# Patient Record
Sex: Male | Born: 1965 | Race: White | Hispanic: No | Marital: Single | Smoking: Current every day smoker
Health system: Southern US, Community
[De-identification: ages and names within clinical notes are randomized; demographics above are authoritative.]

## PROBLEM LIST (undated history)

## (undated) DIAGNOSIS — I219 Acute myocardial infarction, unspecified: Secondary | ICD-10-CM

## (undated) DIAGNOSIS — I10 Essential (primary) hypertension: Secondary | ICD-10-CM

## (undated) DIAGNOSIS — I2510 Atherosclerotic heart disease of native coronary artery without angina pectoris: Secondary | ICD-10-CM

## (undated) DIAGNOSIS — R569 Unspecified convulsions: Secondary | ICD-10-CM

## (undated) HISTORY — PX: CORONARY STENT PLACEMENT: SHX1402

---

## 2014-07-07 ENCOUNTER — Emergency Department (HOSPITAL_COMMUNITY): Payer: Self-pay

## 2014-07-07 ENCOUNTER — Inpatient Hospital Stay (HOSPITAL_COMMUNITY)
Admission: EM | Admit: 2014-07-07 | Discharge: 2014-07-09 | DRG: 641 | Disposition: A | Discharge status: Home / Self Care | Payer: Self-pay | Attending: Internal Medicine | Admitting: Internal Medicine

## 2014-07-07 ENCOUNTER — Encounter (HOSPITAL_COMMUNITY): Payer: Self-pay | Admitting: Emergency Medicine

## 2014-07-07 DIAGNOSIS — I252 Old myocardial infarction: Secondary | ICD-10-CM

## 2014-07-07 DIAGNOSIS — R7401 Elevation of levels of liver transaminase levels: Secondary | ICD-10-CM

## 2014-07-07 DIAGNOSIS — Z955 Presence of coronary angioplasty implant and graft: Secondary | ICD-10-CM

## 2014-07-07 DIAGNOSIS — Y900 Blood alcohol level of less than 20 mg/100 ml: Secondary | ICD-10-CM | POA: Diagnosis present

## 2014-07-07 DIAGNOSIS — R569 Unspecified convulsions: Secondary | ICD-10-CM

## 2014-07-07 DIAGNOSIS — F1010 Alcohol abuse, uncomplicated: Secondary | ICD-10-CM

## 2014-07-07 DIAGNOSIS — R740 Nonspecific elevation of levels of transaminase and lactic acid dehydrogenase [LDH]: Secondary | ICD-10-CM

## 2014-07-07 DIAGNOSIS — Z79899 Other long term (current) drug therapy: Secondary | ICD-10-CM

## 2014-07-07 DIAGNOSIS — Z7982 Long term (current) use of aspirin: Secondary | ICD-10-CM

## 2014-07-07 DIAGNOSIS — F1020 Alcohol dependence, uncomplicated: Secondary | ICD-10-CM | POA: Diagnosis present

## 2014-07-07 DIAGNOSIS — F17200 Nicotine dependence, unspecified, uncomplicated: Secondary | ICD-10-CM

## 2014-07-07 DIAGNOSIS — F17210 Nicotine dependence, cigarettes, uncomplicated: Secondary | ICD-10-CM | POA: Diagnosis present

## 2014-07-07 DIAGNOSIS — I2510 Atherosclerotic heart disease of native coronary artery without angina pectoris: Secondary | ICD-10-CM | POA: Diagnosis present

## 2014-07-07 DIAGNOSIS — G40909 Epilepsy, unspecified, not intractable, without status epilepticus: Secondary | ICD-10-CM | POA: Diagnosis present

## 2014-07-07 DIAGNOSIS — I10 Essential (primary) hypertension: Secondary | ICD-10-CM

## 2014-07-07 DIAGNOSIS — E871 Hypo-osmolality and hyponatremia: Principal | ICD-10-CM

## 2014-07-07 HISTORY — DX: Essential (primary) hypertension: I10

## 2014-07-07 HISTORY — DX: Acute myocardial infarction, unspecified: I21.9

## 2014-07-07 HISTORY — DX: Unspecified convulsions: R56.9

## 2014-07-07 HISTORY — DX: Atherosclerotic heart disease of native coronary artery without angina pectoris: I25.10

## 2014-07-07 LAB — COMPREHENSIVE METABOLIC PANEL
ALBUMIN: 4.3 g/dL (ref 3.5–5.0)
ALT: 102 U/L — AB (ref 17–63)
AST: 77 U/L — AB (ref 15–41)
Alkaline Phosphatase: 72 U/L (ref 38–126)
Anion gap: 10 (ref 5–15)
BUN: 10 mg/dL (ref 6–20)
CO2: 21 mmol/L — ABNORMAL LOW (ref 22–32)
Calcium: 9.1 mg/dL (ref 8.9–10.3)
Chloride: 89 mmol/L — ABNORMAL LOW (ref 101–111)
Creatinine, Ser: 0.68 mg/dL (ref 0.61–1.24)
GFR calc Af Amer: >60 mL/min (ref >60)
GFR calc non Af Amer: >60 mL/min (ref >60)
Glucose, Bld: 123 mg/dL — ABNORMAL HIGH (ref 65–99)
Potassium: 4.9 mmol/L (ref 3.5–5.1)
Sodium: 120 mmol/L — ABNORMAL LOW (ref 135–145)
TOTAL PROTEIN: 7.6 g/dL (ref 6.5–8.1)
Total Bilirubin: 0.5 mg/dL (ref 0.3–1.2)

## 2014-07-07 LAB — CBC WITH DIFFERENTIAL/PLATELET
Basophils Absolute: 0 10*3/uL (ref 0.0–0.1)
Basophils Relative: 0 % (ref 0–1)
EOS PCT: 1 % (ref 0–5)
Eosinophils Absolute: 0.1 10*3/uL (ref 0.0–0.7)
HEMATOCRIT: 39.9 % (ref 39.0–52.0)
Hemoglobin: 14.2 g/dL (ref 13.0–17.0)
LYMPHS PCT: 12 % (ref 12–46)
Lymphs Abs: 1.1 10*3/uL (ref 0.7–4.0)
MCH: 28.3 pg (ref 26.0–34.0)
MCHC: 35.6 g/dL (ref 30.0–36.0)
MCV: 79.6 fL (ref 78.0–100.0)
MONOS PCT: 9 % (ref 3–12)
Monocytes Absolute: 0.9 10*3/uL (ref 0.1–1.0)
NEUTROS ABS: 7.7 10*3/uL (ref 1.7–7.7)
Neutrophils Relative %: 78 % — ABNORMAL HIGH (ref 43–77)
PLATELETS: 312 10*3/uL (ref 150–400)
RBC: 5.01 MIL/uL (ref 4.22–5.81)
RDW: 14.5 % (ref 11.5–15.5)
WBC: 9.8 10*3/uL (ref 4.0–10.5)

## 2014-07-07 LAB — BASIC METABOLIC PANEL
Anion gap: 8 (ref 5–15)
BUN: 9 mg/dL (ref 6–20)
CHLORIDE: 95 mmol/L — AB (ref 101–111)
CO2: 23 mmol/L (ref 22–32)
Calcium: 8.5 mg/dL — ABNORMAL LOW (ref 8.9–10.3)
Creatinine, Ser: 0.67 mg/dL (ref 0.61–1.24)
GLUCOSE: 100 mg/dL — AB (ref 65–99)
Potassium: 4.3 mmol/L (ref 3.5–5.1)
SODIUM: 126 mmol/L — AB (ref 135–145)

## 2014-07-07 LAB — I-STAT CHEM 8, ED
BUN: 9 mg/dL (ref 6–20)
CHLORIDE: 90 mmol/L — AB (ref 101–111)
Calcium, Ion: 1.14 mmol/L (ref 1.12–1.23)
Creatinine, Ser: 0.7 mg/dL (ref 0.61–1.24)
GLUCOSE: 115 mg/dL — AB (ref 65–99)
HEMATOCRIT: 47 % (ref 39.0–52.0)
Hemoglobin: 16 g/dL (ref 13.0–17.0)
POTASSIUM: 5 mmol/L (ref 3.5–5.1)
Sodium: 122 mmol/L — ABNORMAL LOW (ref 135–145)
TCO2: 20 mmol/L (ref 0–100)

## 2014-07-07 LAB — ETHANOL: Alcohol, Ethyl (B): <5 mg/dL (ref <5)

## 2014-07-07 LAB — MAGNESIUM: Magnesium: 1.9 mg/dL (ref 1.7–2.4)

## 2014-07-07 MED ORDER — PRAVASTATIN SODIUM 10 MG PO TABS
20.0000 mg | ORAL_TABLET | Freq: Every day | ORAL | Status: DC
Start: 2014-07-07 — End: 2014-07-09
  Administered 2014-07-07 – 2014-07-08 (×2): 20 mg via ORAL
  Filled 2014-07-07 (×2): qty 2

## 2014-07-07 MED ORDER — SODIUM CHLORIDE 0.9 % IJ SOLN: 3.0000 mL | Freq: Two times a day (BID) | INTRAMUSCULAR | Status: DC

## 2014-07-07 MED ORDER — HEPARIN SODIUM (PORCINE) 5000 UNIT/ML IJ SOLN
5000.0000 [IU] | Freq: Three times a day (TID) | INTRAMUSCULAR | Status: DC
  Administered 2014-07-07 – 2014-07-09 (×5): 5000 [IU] via SUBCUTANEOUS
  Filled 2014-07-07 (×5): qty 1

## 2014-07-07 MED ORDER — ASPIRIN 325 MG PO TABS
325.0000 mg | ORAL_TABLET | Freq: Every day | ORAL | Status: DC
  Administered 2014-07-08 – 2014-07-09 (×2): 325 mg via ORAL
  Filled 2014-07-07 (×2): qty 1

## 2014-07-07 MED ORDER — METOPROLOL TARTRATE 50 MG PO TABS
50.0000 mg | ORAL_TABLET | Freq: Two times a day (BID) | ORAL | Status: DC
  Administered 2014-07-07 – 2014-07-09 (×4): 50 mg via ORAL
  Filled 2014-07-07 (×4): qty 1

## 2014-07-07 MED ORDER — SODIUM CHLORIDE 0.9 % IV SOLN
INTRAVENOUS | Status: AC
  Administered 2014-07-07: 18:00:00 via INTRAVENOUS

## 2014-07-07 MED ORDER — FOLIC ACID 1 MG PO TABS
1.0000 mg | ORAL_TABLET | Freq: Every day | ORAL | Status: DC
  Administered 2014-07-07 – 2014-07-09 (×3): 1 mg via ORAL
  Filled 2014-07-07 (×4): qty 1

## 2014-07-07 MED ORDER — ONDANSETRON HCL 4 MG PO TABS: 4.0000 mg | ORAL_TABLET | Freq: Four times a day (QID) | ORAL | Status: DC | PRN

## 2014-07-07 MED ORDER — LORAZEPAM 2 MG/ML IJ SOLN: 1.0000 mg | Freq: Four times a day (QID) | INTRAMUSCULAR | Status: DC | PRN

## 2014-07-07 MED ORDER — ADULT MULTIVITAMIN W/MINERALS CH
1.0000 | ORAL_TABLET | Freq: Every day | ORAL | Status: DC
  Administered 2014-07-07 – 2014-07-09 (×3): 1 via ORAL
  Filled 2014-07-07 (×3): qty 1

## 2014-07-07 MED ORDER — NICOTINE 7 MG/24HR TD PT24
MEDICATED_PATCH | TRANSDERMAL | Status: AC
  Filled 2014-07-07: qty 1

## 2014-07-07 MED ORDER — NICOTINE 7 MG/24HR TD PT24
7.0000 mg | MEDICATED_PATCH | Freq: Every day | TRANSDERMAL | Status: DC
  Administered 2014-07-07 – 2014-07-09 (×3): 7 mg via TRANSDERMAL
  Filled 2014-07-07 (×6): qty 1

## 2014-07-07 MED ORDER — LISINOPRIL 10 MG PO TABS
40.0000 mg | ORAL_TABLET | Freq: Every day | ORAL | Status: DC
  Administered 2014-07-08 – 2014-07-09 (×2): 40 mg via ORAL
  Filled 2014-07-07 (×2): qty 4

## 2014-07-07 MED ORDER — ONDANSETRON HCL 4 MG/2ML IJ SOLN: 4.0000 mg | Freq: Four times a day (QID) | INTRAMUSCULAR | Status: DC | PRN

## 2014-07-07 MED ORDER — VITAMIN B-1 100 MG PO TABS
100.0000 mg | ORAL_TABLET | Freq: Every day | ORAL | Status: DC
  Administered 2014-07-07 – 2014-07-09 (×3): 100 mg via ORAL
  Filled 2014-07-07 (×3): qty 1

## 2014-07-07 MED ORDER — SODIUM CHLORIDE 0.9 % IV SOLN: INTRAVENOUS | Status: DC

## 2014-07-07 MED ORDER — SODIUM CHLORIDE 0.9 % IV SOLN
INTRAVENOUS | Status: DC
  Administered 2014-07-08 (×2): via INTRAVENOUS

## 2014-07-07 MED ORDER — LORAZEPAM 2 MG/ML IJ SOLN
2.0000 mg | Freq: Once | INTRAMUSCULAR | Status: AC
  Administered 2014-07-07: 2 mg via INTRAVENOUS

## 2014-07-07 MED ORDER — LORAZEPAM 1 MG PO TABS: 1.0000 mg | ORAL_TABLET | Freq: Four times a day (QID) | ORAL | Status: DC | PRN

## 2014-07-07 MED ORDER — LORAZEPAM 2 MG/ML IJ SOLN
INTRAMUSCULAR | Status: AC
  Administered 2014-07-07: 2 mg via INTRAVENOUS
  Filled 2014-07-07: qty 1

## 2014-07-07 MED ORDER — THIAMINE HCL 100 MG/ML IJ SOLN: 100.0000 mg | Freq: Every day | INTRAMUSCULAR | Status: DC

## 2014-07-07 MED ORDER — SODIUM CHLORIDE 0.9 % IV BOLUS (SEPSIS)
1000.0000 mL | Freq: Once | INTRAVENOUS | Status: AC
  Administered 2014-07-07: 1000 mL via INTRAVENOUS

## 2014-07-07 NOTE — ED Notes (Signed)
Ems called to pt's house for witnessed seizure by family. Family states seizure was approximately 1-3 minutes in duration. EMS stated that when they arrived, pt appeared post-ictal but was awake and talking, just a "little confused". Pt has known hx of seizures. Pt states he has anxiety and has been anxious lately due to moving. Pt also states that he had "2 beers" with lunch.

## 2014-07-07 NOTE — ED Provider Notes (Signed)
CSN: [id number]     Arrival date & time 07/07/14  1411 History   First MD Initiated Contact with Patient 07/07/14 1418     Chief Complaint  Patient presents with  . Seizures     (Consider location/radiation/quality/duration/timing/severity/associated sxs/prior Treatment) Patient is a 49 y.o. male presenting with seizures. The history is provided by the patient and the EMS personnel.  Seizures  patient with witnessed seizure lasted about 1-2 minutes. Generalized in nature postictal phase was about 30 minutes. Patient with a history of seizures but never started on seizure meds. Patient does drink alcohol frequently. Patient states that does not been eating very well not sleeping well. Patient felt that he bit his tongue a little bit no incontinence. Last seizure was probably 2 years ago. Patient does relate to having an MRI and EEG in the past and said that the findings were inconclusive. Patient without any specific complaints now.  Past Medical History  Diagnosis Date  . Hypertension   . Seizures   . Coronary artery disease   . Heart attack    History reviewed. No pertinent past surgical history. History reviewed. No pertinent family history. History  Substance Use Topics  . Smoking status: Current Every Day Smoker -- 1.00 packs/day    Types: Cigarettes  . Smokeless tobacco: Not on file  . Alcohol Use: 14.4 oz/week    24 Cans of beer per week    Review of Systems  Constitutional: Positive for appetite change. Negative for fever.  HENT: Negative for congestion.   Eyes: Negative for redness.  Respiratory: Negative for shortness of breath.   Cardiovascular: Negative for chest pain.  Gastrointestinal: Negative for abdominal pain.  Genitourinary: Negative for dysuria and hematuria.  Musculoskeletal: Negative for back pain.  Skin: Negative for rash.  Neurological: Positive for seizures. Negative for headaches.  Hematological: Does not bruise/bleed easily.   Psychiatric/Behavioral: Negative for confusion.      Allergies  Review of patient's allergies indicates no known allergies.  Home Medications   Prior to Admission medications   Medication Sig Start Date End Date Taking? Authorizing Provider  aspirin 325 MG tablet Take 325 mg by mouth daily.   Yes Historical Provider, MD  lisinopril (PRINIVIL,ZESTRIL) 40 MG tablet Take 40 mg by mouth daily.   Yes Historical Provider, MD  lovastatin (MEVACOR) 20 MG tablet Take 20 mg by mouth at bedtime.   Yes Historical Provider, MD  metoprolol (LOPRESSOR) 50 MG tablet Take 50 mg by mouth 2 (two) times daily.   Yes Historical Provider, MD   BP 118/71 mmHg  Pulse 76  Temp(Src) 98.5 F (36.9 C) (Oral)  Resp 32  Ht  (1.651 m)  Wt 160 lb (72.576 kg)  BMI 26.63 kg/m2  SpO2 97% Physical Exam  Constitutional: He is oriented to person, place, and time. He appears well-developed and well-nourished. No distress.  HENT:  Head: Normocephalic and atraumatic.  Mouth/Throat: Oropharynx is clear and moist.  Eyes: Conjunctivae and EOM are normal. Pupils are equal, round, and reactive to light.  Neck: Normal range of motion. Neck supple.  Cardiovascular: Normal rate, regular rhythm and normal heart sounds.   No murmur heard. Pulmonary/Chest: Effort normal and breath sounds normal.  Abdominal: Soft. Bowel sounds are normal. There is no tenderness.  Musculoskeletal: Normal range of motion. He exhibits no edema.  Neurological: He is alert and oriented to person, place, and time. No cranial nerve deficit. He exhibits normal muscle tone. Coordination normal.  Skin: Skin is warm.  No rash noted.  Nursing note and vitals reviewed.   ED Course  Procedures (including critical care time) Labs Review Labs Reviewed  COMPREHENSIVE METABOLIC PANEL - Abnormal; Notable for the following:    Sodium 120 (*)    Chloride 89 (*)    CO2 21 (*)    Glucose, Bld 123 (*)    AST 77 (*)    ALT 102 (*)    All other  components within normal limits  CBC WITH DIFFERENTIAL/PLATELET - Abnormal; Notable for the following:    Neutrophils Relative % 78 (*)    All other components within normal limits  MAGNESIUM  ETHANOL   Results for orders placed or performed during the hospital encounter of 07/07/14  Comprehensive metabolic panel  Result Value Ref Range   Sodium 120 (L) 135 - 145 mmol/L   Potassium 4.9 3.5 - 5.1 mmol/L   Chloride 89 (L) 101 - 111 mmol/L   CO2 21 (L) 22 - 32 mmol/L   Glucose, Bld 123 (H) 65 - 99 mg/dL   BUN 10 6 - 20 mg/dL   Creatinine, Ser 1.910.68 0.61 - 1.24 mg/dL   Calcium 9.1 8.9 - 47.810.3 mg/dL   Total Protein 7.6 6.5 - 8.1 g/dL   Albumin 4.3 3.5 - 5.0 g/dL   AST 77 (H) 15 - 41 U/L   ALT 102 (H) 17 - 63 U/L   Alkaline Phosphatase 72 38 - 126 U/L   Total Bilirubin 0.5 0.3 - 1.2 mg/dL   GFR calc non Af Amer >60 >60 mL/min   GFR calc Af Amer >60 >60 mL/min   Anion gap 10 5 - 15  CBC with Differential/Platelet  Result Value Ref Range   WBC 9.8 4.0 - 10.5 K/uL   RBC 5.01 4.22 - 5.81 MIL/uL   Hemoglobin 14.2 13.0 - 17.0 g/dL   HCT 29.539.9 62.139.0 - 30.852.0 %   MCV 79.6 78.0 - 100.0 fL   MCH 28.3 26.0 - 34.0 pg   MCHC 35.6 30.0 - 36.0 g/dL   RDW 65.714.5 84.611.5 - 96.215.5 %   Platelets 312 150 - 400 K/uL   Neutrophils Relative % 78 (H) 43 - 77 %   Neutro Abs 7.7 1.7 - 7.7 K/uL   Lymphocytes Relative 12 12 - 46 %   Lymphs Abs 1.1 0.7 - 4.0 K/uL   Monocytes Relative 9 3 - 12 %   Monocytes Absolute 0.9 0.1 - 1.0 K/uL   Eosinophils Relative 1 0 - 5 %   Eosinophils Absolute 0.1 0.0 - 0.7 K/uL   Basophils Relative 0 0 - 1 %   Basophils Absolute 0.0 0.0 - 0.1 K/uL  Magnesium  Result Value Ref Range   Magnesium 1.9 1.7 - 2.4 mg/dL     Imaging Review Ct Head Wo Contrast  07/07/2014   CLINICAL DATA:  Seizure and confusion.  EXAM: CT HEAD WITHOUT CONTRAST  TECHNIQUE: Contiguous axial images were obtained from the base of the skull through the vertex without intravenous contrast.  COMPARISON:  None.   FINDINGS: The brain demonstrates no evidence of hemorrhage, infarction, edema, mass effect, extra-axial fluid collection, hydrocephalus or mass lesion. The skull is unremarkable. There is some mucosal thickening and a right frontal air cell extending into the fronto-ethmoid region.  IMPRESSION: Normal head CT.   Electronically Signed   By: Irish LackGlenn  Yamagata M.D.   On: 07/07/2014 16:23     EKG Interpretation   Date/Time:  Wednesday Jul 07 2014 14:12:11 EDT Ventricular  Rate:  80 PR Interval:  222 QRS Duration: 98 QT Interval:  355 QTC Calculation: 409 R Axis:   80 Text Interpretation:  Sinus rhythm Prolonged PR interval No previous ECGs  available Artifact Confirmed by Marylou Wages  MD, Lesha Jager [phone]([id number]) on 07/07/2014  2:20:32 PM      CRITICAL CARE Performed by: Vanetta MuldersZACKOWSKI,Alyrica Thurow Total critical care time: 30 Critical care time was exclusive of separately billable procedures and treating other patients. Critical care was necessary to treat or prevent imminent or life-threatening deterioration. Critical care was time spent personally by me on the following activities: development of treatment plan with patient and/or surrogate as well as nursing, discussions with consultants, evaluation of patient's response to treatment, examination of patient, obtaining history from patient or surrogate, ordering and performing treatments and interventions, ordering and review of laboratory studies, ordering and review of radiographic studies, pulse oximetry and re-evaluation of patient's condition.      MDM   Final diagnoses:  Seizure  Hyponatremia    Recurrent seizure. Patient had seizure prior to arrival. Patient's last seizure was probably greater than 2 years ago not on medication. Patient does drink beer frequently. Could be controlled reading. Also patient sodium is low at 120. Will be treated with normal saline. Treated with Ativan with the second seizure. Will require admission.  Second seizure  lasted about 2 minutes generalized seizure very brief postictal phase. Patient still little confused but is talking. Not back completely normal. Patient was given 2 mg of Ativan during the seizure activity.  Chemistries otherwise without any significant abnormalities. Alcohol level pending.    Vanetta MuldersScott Lauranne Beyersdorf, MD 07/07/14 [phone]

## 2014-07-07 NOTE — H&P (Signed)
[facility] History and Physical  Antonio SwazilandJordan BJY:[id number] DOB: 03/09/1965 DOA: 07/07/2014  Referring physician: Dr Deretha EmoryZackowski - [facility] PCP: No PCP Per Patient   Chief Complaint: Seizure  HPI: Antonio SwazilandJordan is a 49 y.o. male  Patient states he started feeling a little "ill" one day ago. This involved some upset stomach and one or 2 loose stools. Today around approximately 1300 patient had a witnessed seizure which was described as his entire body shaking with his arms pulled close to his body. Of note patient was standing by his bed and fell onto the bed at the time of the seizure. There is no head trauma or other bodily injury at the time of the seizure. The patient approximately 30-45 minutes to "come around". EMS was called to bring patient to [facility]. Of note patient drinks a baseline of 8 beers per day. This is actually an improvement over his past alcoholic drinking which was significantly more. Patient takes very little other nutrition on a daily basis per family members who live with the patient. Patient does not have a primary care physician and does not follow-up at with a physician on a regular basis. Patient has had one other seizure in his life and reports having an MRI and EEG which were normal.  Of note patient is a smoker but reports having difficulty getting through 1 cigarette as this causes him to become short of breath. This patient's baseline over the last many months. Patient continues to smoke half pack per day.  Per patient and family members he has not had any recent complaints of fevers, headache, neck stiffness, rash, chest pain, shortness of breath, palpitations, LOC, dysuria, frequency, back pain, unexplained weight loss, night sweats, melena, hematochezia, hematemesis.   Review of Systems:   Patient currently post ictal and unable to give any more reliable history outside of what is mentioned in history of present illness.  Past Medical History  Diagnosis Date   . Hypertension   . Seizures   . Coronary artery disease   . Heart attack    Past Surgical History  Procedure Laterality Date  . Coronary stent placement     Social History:  reports that he has been smoking Cigarettes.  He has been smoking about 0.50 packs per day. He does not have any smokeless tobacco history on file. He reports that he drinks about 33.6 oz of alcohol per week. His drug history is not on file.  No Known Allergies  Family History  Problem Relation Age of Onset  . Heart attack Father   . Heart attack Mother   . Hypertension Mother      Prior to Admission medications   Medication Sig Start Date End Date Taking? Authorizing Provider  aspirin 325 MG tablet Take 325 mg by mouth daily.   Yes Historical Provider, MD  lisinopril (PRINIVIL,ZESTRIL) 40 MG tablet Take 40 mg by mouth daily.   Yes Historical Provider, MD  lovastatin (MEVACOR) 20 MG tablet Take 20 mg by mouth at bedtime.   Yes Historical Provider, MD  metoprolol (LOPRESSOR) 50 MG tablet Take 50 mg by mouth 2 (two) times daily.   Yes Historical Provider, MD   Physical Exam: Filed Vitals:   07/07/14 1600 07/07/14 1630 07/07/14 1645 07/07/14 1826  BP: 117/72 118/71  134/84  Pulse: 66 76  88  Temp:    99 F (37.2 C)  TempSrc:    Oral  Resp: 16 13 32 16  Height:  Weight:      SpO2: 98% 97%  98%    Wt Readings from Last 3 Encounters:  07/07/14 72.576 kg (160 lb)    General:  Appears calm and comfortable Eyes:  PERRL, normal lids, irises & conjunctiva ENT: Dry mucous membranes  Neck:  no LAD, masses or thyromegaly Cardiovascular:  RRR, faint heart sounds, no m/r/g. No LE edema. Telemetry:  SR, no arrhythmias  Respiratory:  CTA bilaterally, no w/r/r. Normal respiratory effort. Abdomen:  soft, ntnd, NABS Skin:  no rash or induration seen on limited exam Musculoskeletal:  grossly normal tone BUE/BLE Psychiatric:  Alert and oriented 3. Slow to speak but answers questions appropriately. Patient  with understanding as to why he is in the hospital. Neurologic: Cranial nerves II through XII grossly intact, his oxygen in coordinated fashion. .          Labs on Admission:  Basic Metabolic Panel:  Recent Labs Lab 07/07/14 1513 07/07/14 1737  NA 120* 122*  K 4.9 5.0  CL 89* 90*  CO2 21*  --   GLUCOSE 123* 115*  BUN 10 9  CREATININE 0.68 0.70  CALCIUM 9.1  --   MG 1.9  --    Liver Function Tests:  Recent Labs Lab 07/07/14 1513  AST 77*  ALT 102*  ALKPHOS 72  BILITOT 0.5  PROT 7.6  ALBUMIN 4.3   No results for input(s): LIPASE, AMYLASE in the last 168 hours. No results for input(s): AMMONIA in the last 168 hours. CBC:  Recent Labs Lab 07/07/14 1513 07/07/14 1737  WBC 9.8  --   NEUTROABS 7.7  --   HGB 14.2 16.0  HCT 39.9 47.0  MCV 79.6  --   PLT 312  --    Cardiac Enzymes: No results for input(s): CKTOTAL, CKMB, CKMBINDEX, TROPONINI in the last 168 hours.  BNP (last 3 results) No results for input(s): BNP in the last 8760 hours.  ProBNP (last 3 results) No results for input(s): PROBNP in the last 8760 hours.  CBG: No results for input(s): GLUCAP in the last 168 hours.  Radiological Exams on Admission: Ct Head Wo Contrast  07/07/2014   CLINICAL DATA:  Seizure and confusion.  EXAM: CT HEAD WITHOUT CONTRAST  TECHNIQUE: Contiguous axial images were obtained from the base of the skull through the vertex without intravenous contrast.  COMPARISON:  None.  FINDINGS: The brain demonstrates no evidence of hemorrhage, infarction, edema, mass effect, extra-axial fluid collection, hydrocephalus or mass lesion. The skull is unremarkable. There is some mucosal thickening and a right frontal air cell extending into the fronto-ethmoid region.  IMPRESSION: Normal head CT.   Electronically Signed   By: Irish Lack M.D.   On: 07/07/2014 16:23    EKG: Independently reviewed. Sinus, prolonged PR, no sign of ACS  Assessment/Plan Principal Problem:    Hyponatremia Active Problems:   Seizure   ETOH abuse   Tobacco dependence   Transaminitis   Stented coronary artery   Essential hypertension   Seizure: 2 witnessed seizures  (1 in ED ). Likely multifactorial including severe hyponatremia, underlying seizure disorder, alcohol withdrawal. History of one other seizure 2 years ago. Underwent neurologic workup including MRI and EEG and was told there is no known cause for his seizure. Does not take seizure medications. Of note NA 120 on admission. Patient drinks approximately 8 beers per day (this is less than what his previous baseline was ). Head CT negative. Unlikely infectious etiology. WBC 9.8. Afebrile, vital signs  stable. - Telemetry  - UDS - Sodium correction as below - Ativan when necessary  Hyponatremia: NA 120. Likely chronic. Likely secondary to poor nutrition and excessive alcohol intake. Suspect patient progressing towards severe hepatic cirrhosis. - Normal saline - Correction goal of less than 638meq/L increase per 24 hours - BMET Q6  EtOH abuse: Patient's currently drinking a baseline of 8 beers per day. This is a improvement over his previous baselines. - CIWA  Cirrhosis: Suspect patient with underlying cirrhosis though no formal diagnoses given his history of EtOH abuse in presentation with hyponatremia. AST 77, ALT 102 - GI consult in am (to discuss either inpatient versus outpatient workup) - Hepatitis panel  Tobacco abuse: Patient smoking half pack per day. The only reason patient does not smoke [employer] because he is unable to due to a sensation of shortness of breath every time he smokes. Suspect patient with underlying COPD - Nicotine patch - Outpatient PFTs  Upset stomach: Likely secondary to mild viral gastroenteritis versus alcohol withdrawal versus other abdominal process. - Zofran - Monitor  Hypertension: Normotensive on presentation - Continue lisinopril and Metop  CAD: Status post coronary cath and stent  placement several years ago at [facility]. - Continue aspirin, Statin  Social: Patient without insurance and no outpatient medical care  - Social work consult     Code Status: FULL DVT Prophylaxis: Hep Family Communication: Sister and Nephew Disposition Plan: pending improvement     Shadiyah Wernli J, MD Family Medicine [facility] [email or web address] Password [email or web address]

## 2014-07-08 LAB — RAPID URINE DRUG SCREEN, HOSP PERFORMED
AMPHETAMINES: NOT DETECTED
BENZODIAZEPINES: POSITIVE — AB
Barbiturates: NOT DETECTED
COCAINE: NOT DETECTED
Opiates: NOT DETECTED
Tetrahydrocannabinol: NOT DETECTED

## 2014-07-08 LAB — HEPATITIS PANEL, ACUTE
HCV Ab: NEGATIVE
HEP B C IGM: NONREACTIVE
HEP B S AG: NEGATIVE
Hep A IgM: NONREACTIVE

## 2014-07-08 LAB — BASIC METABOLIC PANEL
Anion gap: 7 (ref 5–15)
Anion gap: 11 (ref 5–15)
BUN: 7 mg/dL (ref 6–20)
BUN: 9 mg/dL (ref 6–20)
CALCIUM: 8.4 mg/dL — AB (ref 8.9–10.3)
CO2: 24 mmol/L (ref 22–32)
CO2: 20 mmol/L — ABNORMAL LOW (ref 22–32)
CREATININE: 0.74 mg/dL (ref 0.61–1.24)
CREATININE: 0.79 mg/dL (ref 0.61–1.24)
Calcium: 8.5 mg/dL — ABNORMAL LOW (ref 8.9–10.3)
Chloride: 96 mmol/L — ABNORMAL LOW (ref 101–111)
Chloride: 95 mmol/L — ABNORMAL LOW (ref 101–111)
GFR calc Af Amer: >60 mL/min (ref >60)
GFR calc Af Amer: >60 mL/min (ref >60)
GFR calc non Af Amer: >60 mL/min (ref >60)
GFR calc non Af Amer: >60 mL/min (ref >60)
GLUCOSE: 93 mg/dL (ref 65–99)
GLUCOSE: 138 mg/dL — AB (ref 65–99)
Potassium: 4.1 mmol/L (ref 3.5–5.1)
Potassium: 4.1 mmol/L (ref 3.5–5.1)
SODIUM: 127 mmol/L — AB (ref 135–145)
Sodium: 126 mmol/L — ABNORMAL LOW (ref 135–145)

## 2014-07-08 LAB — TSH: TSH: 4.177 u[IU]/mL (ref 0.350–4.500)

## 2014-07-08 NOTE — Consult Note (Signed)
[place] A. Merlene Laughter, MD     [email or web address]          Antonio Wood is an 49 y.o. male.   ASSESSMENT/PLAN:  1. Likely metabolic induced seizures from hyponatremia. Alcoholism is likely a factor in that it likely consists of the hyponatremia. No evidence of epileptic induced seizures at this time. There is therefore no need for long-term anti-abortive medications. An EEG is suggested. 2. Alcoholism. Alcohol cessation was discussed at length with the patient. 3. Hyponatremia.   The patient is a 49 year old white male who presented to the hospital after having 2 seizures. The patient does consume large amount of alcohol. They told me that he only had a half a can of our call on the morning when he woke up. His seizure was occurred a few hours later at approximately 2 PM. The workup was significant for a low sodium of 120. The patient tells me that he has had seizures before. He tells me that he had one about 3 years ago at [facility] where he was worked up and apparently had imaging tests which were unrevealing. He had also another set of seizures before this and he was worked up similarly although he cannot work. I did go on [facility] website and it appears that he had a head CT scan in 2011 which was unrevealing. The scan was done for seizures. I do not see the accompanying notes however. It appears that he did bite his tongue on both sides and does have some pain from that. Otherwise he is doing well. The patient reports that he is at baseline. He does not complain of symptoms at this time. No chest pain, show spread, GI GU symptoms are reported no headaches, fevers or focal numbness or weakness.  GENERAL: This a pleasant man in no acute distress.  HEENT: Supple. Atraumatic normocephalic.   ABDOMEN: soft  EXTREMITIES: No edema   BACK: Normal.  SKIN: Normal by inspection.    MENTAL STATUS: Alert and oriented. Speech, language and cognition are generally intact.  Judgment and insight normal.   CRANIAL NERVES: Pupils are equal, round and reactive to light and accommodation; extra ocular movements are full, there is no significant nystagmus; visual fields are full; upper and lower facial muscles are normal in strength and symmetric, there is no flattening of the nasolabial folds; tongue is midline; uvula is midline; shoulder elevation is normal.  MOTOR: Normal tone, bulk and strength; no pronator drift.  COORDINATION: Left finger to nose is normal, right finger to nose is normal, No rest tremor; no intention tremor; no postural tremor; no bradykinesia.  REFLEXES: Deep tendon reflexes are symmetrical and normal. Babinski reflexes are flexor bilaterally.   SENSATION: Normal to light touch.    Blood pressure 152/103, pulse 71, temperature 98.9 F (37.2 C), temperature source Oral, resp. rate 20, height 5' 6"  (1.676 m), weight 67.677 kg (149 lb 3.2 oz), SpO2 99 %.  Past Medical History  Diagnosis Date  . Hypertension   . Seizures   . Coronary artery disease   . Heart attack     Past Surgical History  Procedure Laterality Date  . Coronary stent placement      Family History  Problem Relation Age of Onset  . Heart attack Father   . Heart attack Mother   . Hypertension Mother     Social History:  reports that he has been smoking Cigarettes.  He has a 10 pack-year smoking history. He does not have any  smokeless tobacco history on file. He reports that he drinks about 33.6 oz of alcohol per week. His drug history is not on file.  Allergies: No Known Allergies  Medications: Prior to Admission medications   Medication Sig Start Date End Date Taking? Authorizing Provider  aspirin 325 MG tablet Take 325 mg by mouth daily.   Yes Historical Provider, MD  lisinopril (PRINIVIL,ZESTRIL) 40 MG tablet Take 40 mg by mouth daily.   Yes Historical Provider, MD  lovastatin (MEVACOR) 20 MG tablet Take 20 mg by mouth at bedtime.   Yes Historical Provider,  MD  metoprolol (LOPRESSOR) 50 MG tablet Take 50 mg by mouth 2 (two) times daily.   Yes Historical Provider, MD    Scheduled Meds: . aspirin  325 mg Oral Daily  . folic acid  1 mg Oral Daily  . heparin  5,000 Units Subcutaneous 3 times per day  . lisinopril  40 mg Oral Daily  . metoprolol  50 mg Oral BID  . multivitamin with minerals  1 tablet Oral Daily  . nicotine  7 mg Transdermal Daily  . pravastatin  20 mg Oral q1800  . sodium chloride  3 mL Intravenous Q12H  . thiamine  100 mg Oral Daily   Or  . thiamine  100 mg Intravenous Daily   Continuous Infusions: . sodium chloride     PRN Meds:.LORazepam **OR** LORazepam, ondansetron **OR** ondansetron (ZOFRAN) IV     Results for orders placed or performed during the hospital encounter of 07/07/14 (from the past 48 hour(s))  Comprehensive metabolic panel     Status: Abnormal   Collection Time: 07/07/14  3:13 PM  Result Value Ref Range   Sodium 120 (L) 135 - 145 mmol/L   Potassium 4.9 3.5 - 5.1 mmol/L   Chloride 89 (L) 101 - 111 mmol/L   CO2 21 (L) 22 - 32 mmol/L   Glucose, Bld 123 (H) 65 - 99 mg/dL   BUN 10 6 - 20 mg/dL   Creatinine, Ser 0.68 0.61 - 1.24 mg/dL   Calcium 9.1 8.9 - 10.3 mg/dL   Total Protein 7.6 6.5 - 8.1 g/dL   Albumin 4.3 3.5 - 5.0 g/dL   AST 77 (H) 15 - 41 U/L   ALT 102 (H) 17 - 63 U/L   Alkaline Phosphatase 72 38 - 126 U/L   Total Bilirubin 0.5 0.3 - 1.2 mg/dL   GFR calc non Af Amer >60 >60 mL/min   GFR calc Af Amer >60 >60 mL/min    Comment: (NOTE) The eGFR has been calculated using the CKD EPI equation. This calculation has not been validated in all clinical situations. eGFR's persistently <60 mL/min signify possible Chronic Kidney Disease.    Anion gap 10 5 - 15  CBC with Differential/Platelet     Status: Abnormal   Collection Time: 07/07/14  3:13 PM  Result Value Ref Range   WBC 9.8 4.0 - 10.5 K/uL   RBC 5.01 4.22 - 5.81 MIL/uL   Hemoglobin 14.2 13.0 - 17.0 g/dL   HCT 39.9 39.0 - 52.0 %    MCV 79.6 78.0 - 100.0 fL   MCH 28.3 26.0 - 34.0 pg   MCHC 35.6 30.0 - 36.0 g/dL   RDW 14.5 11.5 - 15.5 %   Platelets 312 150 - 400 K/uL   Neutrophils Relative % 78 (H) 43 - 77 %   Neutro Abs 7.7 1.7 - 7.7 K/uL   Lymphocytes Relative 12 12 - 46 %  Lymphs Abs 1.1 0.7 - 4.0 K/uL   Monocytes Relative 9 3 - 12 %   Monocytes Absolute 0.9 0.1 - 1.0 K/uL   Eosinophils Relative 1 0 - 5 %   Eosinophils Absolute 0.1 0.0 - 0.7 K/uL   Basophils Relative 0 0 - 1 %   Basophils Absolute 0.0 0.0 - 0.1 K/uL  Magnesium     Status: None   Collection Time: 07/07/14  3:13 PM  Result Value Ref Range   Magnesium 1.9 1.7 - 2.4 mg/dL  Ethanol     Status: None   Collection Time: 07/07/14  3:13 PM  Result Value Ref Range   Alcohol, Ethyl (B) <5 <5 mg/dL    Comment:        LOWEST DETECTABLE LIMIT FOR SERUM ALCOHOL IS 11 mg/dL FOR MEDICAL PURPOSES ONLY   Hepatitis panel, acute     Status: None   Collection Time: 07/07/14  3:13 PM  Result Value Ref Range   Hepatitis B Surface Ag NEGATIVE NEGATIVE   HCV Ab NEGATIVE NEGATIVE   Hep A IgM NON REACTIVE NON REACTIVE    Comment: (NOTE) Effective January 04, 2014, Hepatitis Acute Panel (test code [phone]) will be revised to automatically reflex to the Hepatitis C Viral RNA, Quantitative, Real-Time PCR assay if the Hepatitis C antibody screening result is Reactive. This action is being taken to ensure that the CDC/USPSTF recommended HCV diagnostic algorithm with the appropriate test reflex needed for accurate interpretation is followed.    Hep B C IgM NON REACTIVE NON REACTIVE    Comment: (NOTE) High levels of Hepatitis B Core IgM antibody are detectable during the acute stage of Hepatitis B. This antibody is used to differentiate current from past HBV infection. Performed at [facility]     Status: Abnormal   Collection Time: 07/07/14  5:37 PM  Result Value Ref Range   Sodium 122 (L) 135 - 145 mmol/L   Potassium 5.0 3.5 -  5.1 mmol/L   Chloride 90 (L) 101 - 111 mmol/L   BUN 9 6 - 20 mg/dL   Creatinine, Ser 0.70 0.61 - 1.24 mg/dL   Glucose, Bld 115 (H) 65 - 99 mg/dL   Calcium, Ion 1.14 1.12 - 1.23 mmol/L   TCO2 20 0 - 100 mmol/L   Hemoglobin 16.0 13.0 - 17.0 g/dL   HCT 47.0 39.0 - 97.9 %  Basic metabolic panel     Status: Abnormal   Collection Time: 07/07/14 10:41 PM  Result Value Ref Range   Sodium 126 (L) 135 - 145 mmol/L   Potassium 4.3 3.5 - 5.1 mmol/L   Chloride 95 (L) 101 - 111 mmol/L   CO2 23 22 - 32 mmol/L   Glucose, Bld 100 (H) 65 - 99 mg/dL   BUN 9 6 - 20 mg/dL   Creatinine, Ser 0.67 0.61 - 1.24 mg/dL   Calcium 8.5 (L) 8.9 - 10.3 mg/dL   GFR calc non Af Amer >60 >60 mL/min   GFR calc Af Amer >60 >60 mL/min    Comment: (NOTE) The eGFR has been calculated using the CKD EPI equation. This calculation has not been validated in all clinical situations. eGFR's persistently <60 mL/min signify possible Chronic Kidney Disease.    Anion gap 8 5 - 15  Basic metabolic panel     Status: Abnormal   Collection Time: 07/08/14  8:13 AM  Result Value Ref Range   Sodium 127 (L) 135 -  145 mmol/L   Potassium 4.1 3.5 - 5.1 mmol/L   Chloride 96 (L) 101 - 111 mmol/L   CO2 24 22 - 32 mmol/L   Glucose, Bld 93 65 - 99 mg/dL   BUN 7 6 - 20 mg/dL   Creatinine, Ser 0.74 0.61 - 1.24 mg/dL   Calcium 8.5 (L) 8.9 - 10.3 mg/dL   GFR calc non Af Amer >60 >60 mL/min   GFR calc Af Amer >60 >60 mL/min    Comment: (NOTE) The eGFR has been calculated using the CKD EPI equation. This calculation has not been validated in all clinical situations. eGFR's persistently <60 mL/min signify possible Chronic Kidney Disease.    Anion gap 7 5 - 15  Urine rapid drug screen (hosp performed)     Status: Abnormal   Collection Time: 07/08/14 10:00 AM  Result Value Ref Range   Opiates NONE DETECTED NONE DETECTED   Cocaine NONE DETECTED NONE DETECTED   Benzodiazepines POSITIVE (A) NONE DETECTED   Amphetamines NONE DETECTED NONE  DETECTED   Tetrahydrocannabinol NONE DETECTED NONE DETECTED   Barbiturates NONE DETECTED NONE DETECTED    Comment:        DRUG SCREEN FOR MEDICAL PURPOSES ONLY.  IF CONFIRMATION IS NEEDED FOR ANY PURPOSE, NOTIFY LAB WITHIN 5 DAYS.        LOWEST DETECTABLE LIMITS FOR URINE DRUG SCREEN Drug Class       Cutoff (ng/mL) Amphetamine      1000 Barbiturate      200 Benzodiazepine   459 Tricyclics       977 Opiates          300 Cocaine          300 THC              50   Basic metabolic panel     Status: Abnormal   Collection Time: 07/08/14 12:35 PM  Result Value Ref Range   Sodium 126 (L) 135 - 145 mmol/L   Potassium 4.1 3.5 - 5.1 mmol/L   Chloride 95 (L) 101 - 111 mmol/L   CO2 20 (L) 22 - 32 mmol/L   Glucose, Bld 138 (H) 65 - 99 mg/dL   BUN 9 6 - 20 mg/dL   Creatinine, Ser 0.79 0.61 - 1.24 mg/dL   Calcium 8.4 (L) 8.9 - 10.3 mg/dL   GFR calc non Af Amer >60 >60 mL/min   GFR calc Af Amer >60 >60 mL/min    Comment: (NOTE) The eGFR has been calculated using the CKD EPI equation. This calculation has not been validated in all clinical situations. eGFR's persistently <60 mL/min signify possible Chronic Kidney Disease.    Anion gap 11 5 - 15    Studies/Results: HEAD CT FINDINGS: The brain demonstrates no evidence of hemorrhage, infarction, edema, mass effect, extra-axial fluid collection, hydrocephalus or mass lesion. The skull is unremarkable. There is some mucosal thickening and a right frontal air cell extending into the fronto-ethmoid region.  IMPRESSION: Normal head CT.     Donnalyn Juran A. Merlene Laughter, M.D.  Diplomate, Tax adviser of Psychiatry and Neurology ( Neurology). 07/08/2014, 5:44 PM

## 2014-07-08 NOTE — Progress Notes (Signed)
UR chart review completed.  

## 2014-07-08 NOTE — Care Management Note (Signed)
Case Management Note  Patient Details  Name: Antonio Wood MRN: [id number] Date of Birth: 08/09/1965  Subjective/Objective:                  Pt admitted from home with seizures. Pt lives with his sister and will return home at discharge. Pt is independent with ADl's.  Action/Plan: Financial counselor is aware of self pay status and will come talk with pt. [facility] pts [facility]. Pt made aware of above.  Expected Discharge Date:                  Expected Discharge Plan:  Home/Self Care  In-House Referral:  Financial Counselor  Discharge planning Services  CM Consult  Post Acute Care Choice:  NA Choice offered to:     DME Arranged:    DME Agency:     HH Arranged:    HH Agency:     Status of Service:  Completed, signed off  Medicare Important Message Given:    Date Medicare IM Given:    Medicare IM give by:    Date Additional Medicare IM Given:    Additional Medicare Important Message give by:     If discussed at Long Length of Stay Meetings, dates discussed:    Additional Comments:  [name], [name], RN 07/08/2014, 11:47 AM

## 2014-07-08 NOTE — Progress Notes (Signed)
[facility] PROGRESS NOTE  Deantae SwazilandJordan ZOX:[id number] DOB: 02/05/1966 DOA: 07/07/2014 PCP: No PCP Per Patient  Assessment/Plan: Seizure -Likely related to hyponatremia.  -Do not plan on starting antiepileptics at this time.  Hyponatremia -Sodium has improved from 120 on admission to 127 currently. -Continue normal saline. -Suspect etiology is beer potomania.  Alcohol abuse -No signs of withdrawal at present. -Child psychotherapistocial worker has provided him with outpatient resources for alcohol cessation.  Tobacco abuse -Nicotine patch.  Code Status: Full code Family Communication: Sister Neysa BonitoChristy at bedside updated on plan of care  Disposition Plan: Home when ready, 24-48 hours anticipated   Consultants:  None   Antibiotics:  None   Subjective: No complaints at present, denies chest pain, shortness of breath, nausea vomiting  Objective: Filed Vitals:   07/07/14 1826 07/07/14 2149 07/08/14 0516 07/08/14 1416  BP: 134/84 130/82 118/76 152/103  Pulse: 88 83 79 71  Temp: 99 F (37.2 C) 98.7 F (37.1 C) 98.7 F (37.1 C) 98.9 F (37.2 C)  TempSrc: Oral Oral Oral Oral  Resp: 16  20 20   Height: 5\' 6"  (1.676 m)     Weight: 67.677 kg (149 lb 3.2 oz)     SpO2: 98% 98% 99% 99%    Intake/Output Summary (Last 24 hours) at 07/08/14 1614 Last data filed at 07/08/14 1300  Gross per 24 hour  Intake   1580 ml  Output   1700 ml  Net   -120 ml   Filed Weights   07/07/14 1415 07/07/14 1826  Weight: 72.576 kg (160 lb) 67.677 kg (149 lb 3.2 oz)    Exam:   General:  Alert, awake, oriented 3, no current distress  Cardiovascular: Regular rate and rhythm, no murmurs, rubs or gallops  Respiratory: Clear to auscultation bilaterally  Abdomen: Soft, nontender, nondistended, positive bowel sounds  Extremities: No clubbing, cyanosis or edema, positive pulses   Neurologic:  Grossly intact and nonfocal.  Data Reviewed: Basic Metabolic Panel:  Recent Labs Lab  07/07/14 1513 07/07/14 1737 07/07/14 2241 07/08/14 0813 07/08/14 1235  NA 120* 122* 126* 127* 126*  K 4.9 5.0 4.3 4.1 4.1  CL 89* 90* 95* 96* 95*  CO2 21*  --  23 24 20*  GLUCOSE 123* 115* 100* 93 138*  BUN 10 9 9 7 9   CREATININE 0.68 0.70 0.67 0.74 0.79  CALCIUM 9.1  --  8.5* 8.5* 8.4*  MG 1.9  --   --   --   --    Liver Function Tests:  Recent Labs Lab 07/07/14 1513  AST 77*  ALT 102*  ALKPHOS 72  BILITOT 0.5  PROT 7.6  ALBUMIN 4.3   No results for input(s): LIPASE, AMYLASE in the last 168 hours. No results for input(s): AMMONIA in the last 168 hours. CBC:  Recent Labs Lab 07/07/14 1513 07/07/14 1737  WBC 9.8  --   NEUTROABS 7.7  --   HGB 14.2 16.0  HCT 39.9 47.0  MCV 79.6  --   PLT 312  --    Cardiac Enzymes: No results for input(s): CKTOTAL, CKMB, CKMBINDEX, TROPONINI in the last 168 hours. BNP (last 3 results) No results for input(s): BNP in the last 8760 hours.  ProBNP (last 3 results) No results for input(s): PROBNP in the last 8760 hours.  CBG: No results for input(s): GLUCAP in the last 168 hours.  No results found for this or any previous visit (from the past 240 hour(s)).   Studies: Ct Head Wo  Contrast  07/07/2014   CLINICAL DATA:  Seizure and confusion.  EXAM: CT HEAD WITHOUT CONTRAST  TECHNIQUE: Contiguous axial images were obtained from the base of the skull through the vertex without intravenous contrast.  COMPARISON:  None.  FINDINGS: The brain demonstrates no evidence of hemorrhage, infarction, edema, mass effect, extra-axial fluid collection, hydrocephalus or mass lesion. The skull is unremarkable. There is some mucosal thickening and a right frontal air cell extending into the fronto-ethmoid region.  IMPRESSION: Normal head CT.   Electronically Signed   By: Irish LackGlenn  Yamagata M.D.   On: 07/07/2014 16:23    Scheduled Meds: . aspirin  325 mg Oral Daily  . folic acid  1 mg Oral Daily  . heparin  5,000 Units Subcutaneous 3 times per day  .  lisinopril  40 mg Oral Daily  . metoprolol  50 mg Oral BID  . multivitamin with minerals  1 tablet Oral Daily  . nicotine  7 mg Transdermal Daily  . pravastatin  20 mg Oral q1800  . sodium chloride  3 mL Intravenous Q12H  . thiamine  100 mg Oral Daily   Or  . thiamine  100 mg Intravenous Daily   Continuous Infusions: . sodium chloride    . sodium chloride 100 mL/hr at 07/08/14 1610    Principal Problem:   Hyponatremia Active Problems:   Seizure   ETOH abuse   Tobacco dependence   Transaminitis   Stented coronary artery   Essential hypertension    Time spent: 30 minutes. Greater than 50% of this time was spent in direct contact with the patient coordinating care.    Chaya JanHERNANDEZ ACOSTA,ESTELA  [facility] Pager [phone]  If 7PM-7AM, please contact night-coverage at [email or web address], password [email or web address] 07/08/2014, 4:14 PM  LOS: 1 day
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
## 2014-07-08 NOTE — Clinical Social Work Note (Signed)
CSW received consult as pt has no insurance. Financial counselor to see pt. CSW will sign off, but can be reconsulted if needed.  Derenda FennelKara Kamari Bilek, LCSW [phone]

## 2014-07-09 ENCOUNTER — Other Ambulatory Visit (HOSPITAL_COMMUNITY): Payer: Self-pay

## 2014-07-09 LAB — CBC
HEMATOCRIT: 37.1 % — AB (ref 39.0–52.0)
Hemoglobin: 12.7 g/dL — ABNORMAL LOW (ref 13.0–17.0)
MCH: 28.4 pg (ref 26.0–34.0)
MCHC: 34.2 g/dL (ref 30.0–36.0)
MCV: 83 fL (ref 78.0–100.0)
Platelets: 286 10*3/uL (ref 150–400)
RBC: 4.47 MIL/uL (ref 4.22–5.81)
RDW: 14.7 % (ref 11.5–15.5)
WBC: 7.5 10*3/uL (ref 4.0–10.5)

## 2014-07-09 LAB — BASIC METABOLIC PANEL
Anion gap: 9 (ref 5–15)
BUN: 7 mg/dL (ref 6–20)
CHLORIDE: 96 mmol/L — AB (ref 101–111)
CO2: 25 mmol/L (ref 22–32)
Calcium: 8.8 mg/dL — ABNORMAL LOW (ref 8.9–10.3)
Creatinine, Ser: 0.69 mg/dL (ref 0.61–1.24)
GFR calc non Af Amer: >60 mL/min (ref >60)
GLUCOSE: 108 mg/dL — AB (ref 65–99)
POTASSIUM: 3.6 mmol/L (ref 3.5–5.1)
SODIUM: 130 mmol/L — AB (ref 135–145)

## 2014-07-09 LAB — VITAMIN B12: VITAMIN B 12: 316 pg/mL (ref 180–914)

## 2014-07-09 MED ORDER — FOLIC ACID 1 MG PO TABS: 1.0000 mg | ORAL_TABLET | Freq: Every day | ORAL | Status: AC

## 2014-07-09 MED ORDER — ADULT MULTIVITAMIN W/MINERALS CH: 1.0000 | ORAL_TABLET | Freq: Every day | ORAL | Status: AC

## 2014-07-09 NOTE — Discharge Summary (Signed)
Physician Discharge Summary  Antonio Wood WUX:[id number] DOB: 07/17/1965 DOA: 07/07/2014  PCP: No PCP Per Patient  Admit date: 07/07/2014 Discharge date: 07/09/2014  Time spent: 45 minutes  Recommendations for Outpatient Follow-up:  -Will be discharge home today.  -Follow-up appointment has been arranged with the health department. -Outpatient alcohol cessation resources have been provided to him by social work.   Discharge Diagnoses:  Principal Problem:   Hyponatremia Active Problems:   Seizure   ETOH abuse   Tobacco dependence   Transaminitis   Stented coronary artery   Essential hypertension   Discharge Condition: Stable and improved  Filed Weights   07/07/14 1415 07/07/14 1826  Weight: 72.576 kg (160 lb) 67.677 kg (149 lb 3.2 oz)    History of present illness:  Antonio Wood is a 49 y.o. male  Patient states he started feeling a little "ill" one day ago. This involved some upset stomach and one or 2 loose stools. Today around approximately 1300 patient had a witnessed seizure which was described as his entire body shaking with his arms pulled close to his body. Of note patient was standing by his bed and fell onto the bed at the time of the seizure. There is no head trauma or other bodily injury at the time of the seizure. The patient approximately 30-45 minutes to "come around". EMS was called to bring patient to [facility]. Of note patient drinks a baseline of 8 beers per day. This is actually an improvement over his past alcoholic drinking which was significantly more. Patient takes very little other nutrition on a daily basis per family members who live with the patient. Patient does not have a primary care physician and does not follow-up at with a physician on a regular basis. Patient has had one other seizure in his life and reports having an MRI and EEG which were normal.  Of note patient is a smoker but reports having difficulty getting through 1 cigarette as this  causes him to become short of breath. This patient's baseline over the last many months. Patient continues to smoke half pack per day.  Per patient and family members he has not had any recent complaints of fevers, headache, neck stiffness, rash, chest pain, shortness of breath, palpitations, LOC, dysuria, frequency, back pain, unexplained weight loss, night sweats, melena, hematochezia, hematemesis.   Hospital Course:   Seizure -Likely metabolic related to hyponatremia. -No plan to start antiepileptics at this time. -Seen in consultation by neurology, Dr. Gerilyn Pilgrimoonquah, who agrees with this plan.  Hyponatremia -Sodium improved from 120 on admission to 1:30 on discharge with normal saline infusion. 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  Alcohol abuse -No signs of withdrawal. -Will be discharged on thiamine, folate, multivitamin. -Social work has provided him with outpatient resources for alcohol cessation.  Tobacco abuse -Not interested in cessation, nicotine patch was placed while in the hospital.  Procedures:  None   Consultations:  Neurology, Dr. Gerilyn Pilgrimoonquah.  Discharge Instructions  Discharge Instructions    Increase activity slowly    Complete by:  As directed             Medication List    TAKE these medications        aspirin 325 MG tablet  Take 325 mg by mouth daily.     folic acid 1 MG tablet  Commonly known as:  FOLVITE  Take 1 tablet (1 mg total) by mouth daily.     lisinopril 40 MG tablet  Commonly known  as:  PRINIVIL,ZESTRIL  Take 40 mg by mouth daily.     lovastatin 20 MG tablet  Commonly known as:  MEVACOR  Take 20 mg by mouth at bedtime.     metoprolol 50 MG tablet  Commonly known as:  LOPRESSOR  Take 50 mg by mouth 2 (two) times daily.     multivitamin with minerals Tabs tablet  Take 1 tablet by mouth daily.       No Known Allergies     Follow-up Information    [facility]   Specialty:   Occupational Therapy   Why:  at 1:00   Contact information:   [address] [place] [id number] [phone]        The results of significant diagnostics from this hospitalization (including imaging, microbiology, ancillary and laboratory) are listed below for reference.    Significant Diagnostic Studies: Ct Head Wo Contrast  07/07/2014   CLINICAL DATA:  Seizure and confusion.  EXAM: CT HEAD WITHOUT CONTRAST  TECHNIQUE: Contiguous axial images were obtained from the base of the skull through the vertex without intravenous contrast.  COMPARISON:  None.  FINDINGS: The brain demonstrates no evidence of hemorrhage, infarction, edema, mass effect, extra-axial fluid collection, hydrocephalus or mass lesion. The skull is unremarkable. There is some mucosal thickening and a right frontal air cell extending into the fronto-ethmoid region.  IMPRESSION: Normal head CT.   Electronically Signed   By: Irish LackGlenn  Yamagata M.D.   On: 07/07/2014 16:23    Microbiology: No results found for this or any previous visit (from the past 240 hour(s)).   Labs: Basic Metabolic Panel:  Recent Labs Lab 07/07/14 1513 07/07/14 1737 07/07/14 2241 07/08/14 0813 07/08/14 1235 07/09/14 0616  NA 120* 122* 126* 127* 126* 130*  K 4.9 5.0 4.3 4.1 4.1 3.6  CL 89* 90* 95* 96* 95* 96*  CO2 21*  --  23 24 20* 25  GLUCOSE 123* 115* 100* 93 138* 108*  BUN 10 9 9 7 9 7   CREATININE 0.68 0.70 0.67 0.74 0.79 0.69  CALCIUM 9.1  --  8.5* 8.5* 8.4* 8.8*  MG 1.9  --   --   --   --   --    Liver Function Tests:  Recent Labs Lab 07/07/14 1513  AST 77*  ALT 102*  ALKPHOS 72  BILITOT 0.5  PROT 7.6  ALBUMIN 4.3   No results for input(s): LIPASE, AMYLASE in the last 168 hours. No results for input(s): AMMONIA in the last 168 hours. CBC:  Recent Labs Lab 07/07/14 1513 07/07/14 1737 07/09/14 0616  WBC 9.8  --  7.5  NEUTROABS 7.7  --   --   HGB 14.2 16.0 12.7*  HCT 39.9 47.0 37.1*  MCV 79.6  --  83.0    PLT 312  --  286   Cardiac Enzymes: No results for input(s): CKTOTAL, CKMB, CKMBINDEX, TROPONINI in the last 168 hours. BNP: BNP (last 3 results) No results for input(s): BNP in the last 8760 hours.  ProBNP (last 3 results) No results for input(s): PROBNP in the last 8760 hours.  CBG: No results for input(s): GLUCAP in the last 168 hours.     SignedChaya Jan:  HERNANDEZ ACOSTA,ESTELA  [facility] Pager: [phone] 07/09/2014, 10:57 AM

## 2014-07-09 NOTE — Progress Notes (Signed)
Discharge instructions given to patient on medications,and follow up visits,patient verbalized understanding. Vital signs stable. Accompanied by staff to an awaiting vehicle.

## 2014-07-09 NOTE — Care Management Note (Signed)
Case Management Note  Patient Details  Name: Antonio Wood MRN: [id number] Date of Birth: 05/08/1965  Expected Discharge Date:                  Expected Discharge Plan:  Home/Self Care  In-House Referral:  Financial Counselor  Discharge planning Services  CM Consult  Post Acute Care Choice:  NA Choice offered to:     DME Arranged:    DME Agency:     HH Arranged:    HH Agency:     Status of Service:  Completed, signed off  Medicare Important Message Given:    Date Medicare IM Given:    Medicare IM give by:    Date Additional Medicare IM Given:    Additional Medicare Important Message give by:     If discussed at Long Length of Stay Meetings, dates discussed:    Additional Comments: Pt discharging home today. Pt has f/u appointment at the health department, all prescribed [facility], pt made aware.No CM needs.  Malcolm Metrohildress, Broden Holt Demske, RN 07/09/2014, 11:39 AM

## 2014-07-10 LAB — HIV ANTIBODY (ROUTINE TESTING W REFLEX): HIV Screen 4th Generation wRfx: NONREACTIVE

## 2014-07-10 LAB — RPR: RPR Ser Ql: NONREACTIVE

## 2014-07-11 LAB — HOMOCYSTEINE: HOMOCYSTEINE-NORM: 10.1 umol/L (ref 0.0–15.0)

## 2015-11-18 IMAGING — CT CT HEAD W/O CM
1 series · 16 of 30 positions shown, 20 images · non-contrast
Comparison: None.

CLINICAL DATA: Seizure and confusion.

EXAM:
CT HEAD WITHOUT CONTRAST
TECHNIQUE: Contiguous axial images were obtained from the base of the skull
through the vertex without intravenous contrast.

[Series 2: headtrauma 4.8 h37s · axial · 0.50mm/px · z∈[+138,+298]mm · 16 of 36 slices shown, 20 images]
[im 2/36  brain]
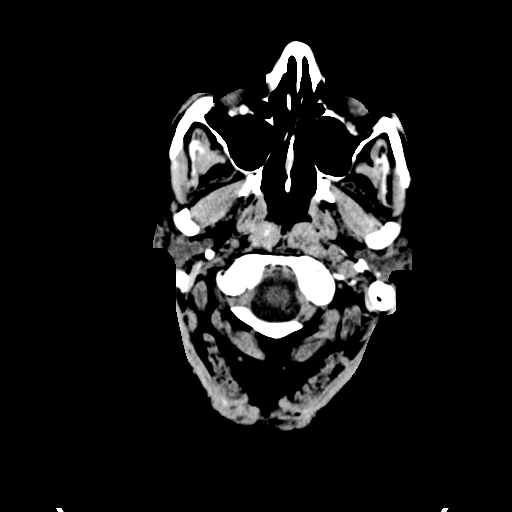
[im 2/36  bone]
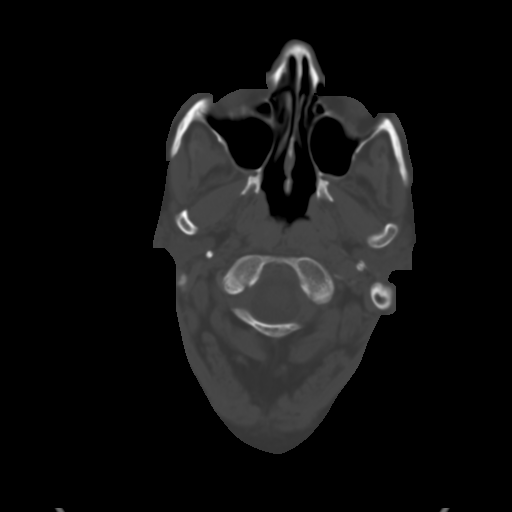
[im 4/36  brain]
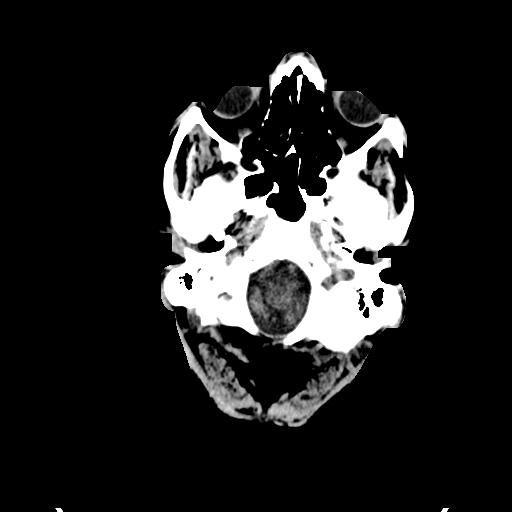
[im 7/36  brain]
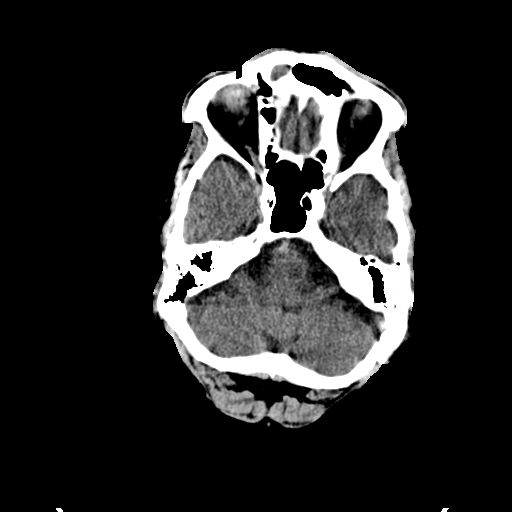
[im 9/36  brain]
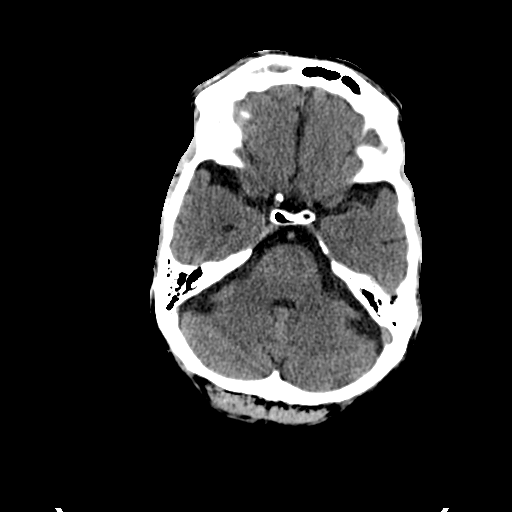
[im 10/36  brain]
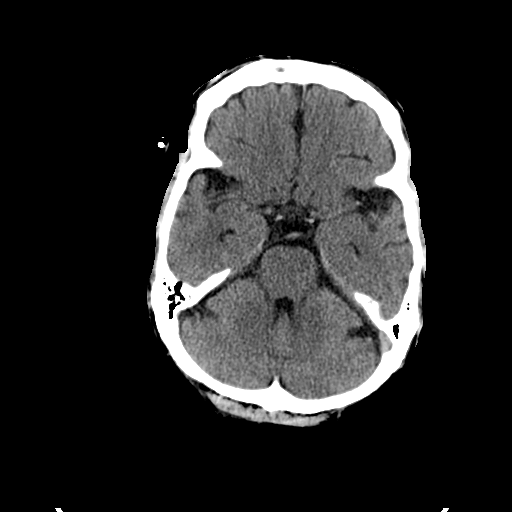
[im 10/36  bone]
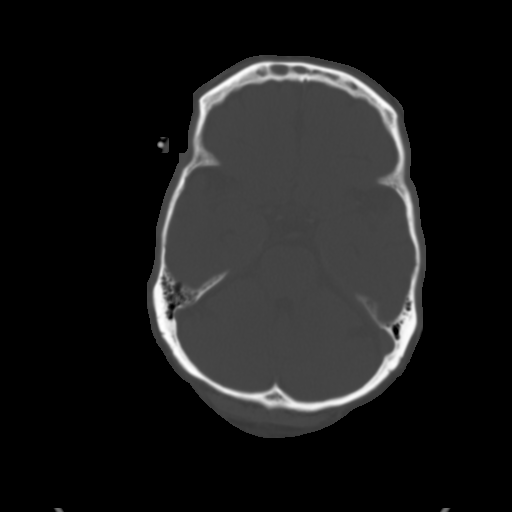
[im 13/36  brain]
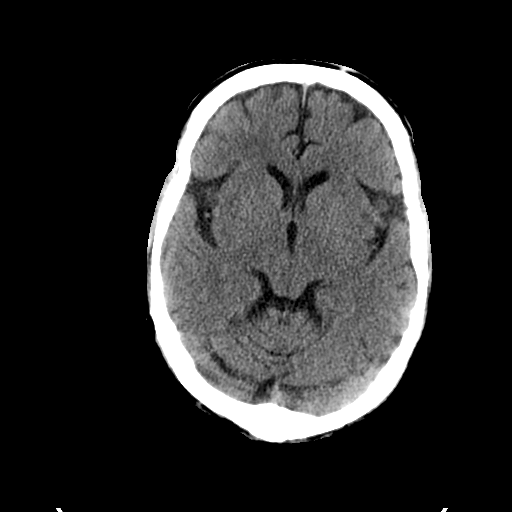
[im 15/36  brain]
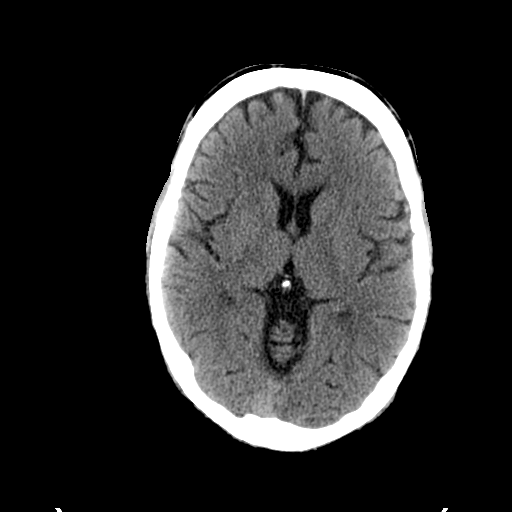
[im 17/36  brain]
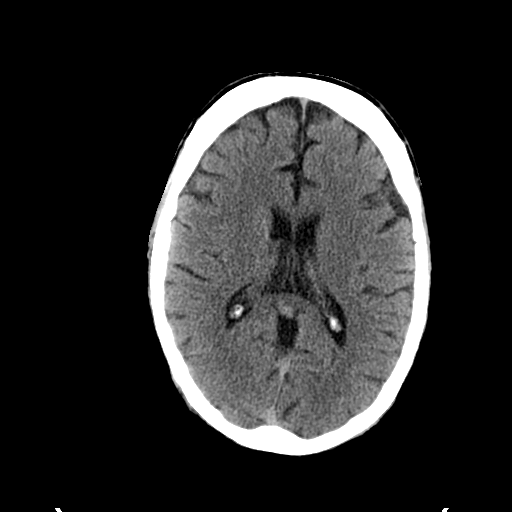
[im 19/36  brain]
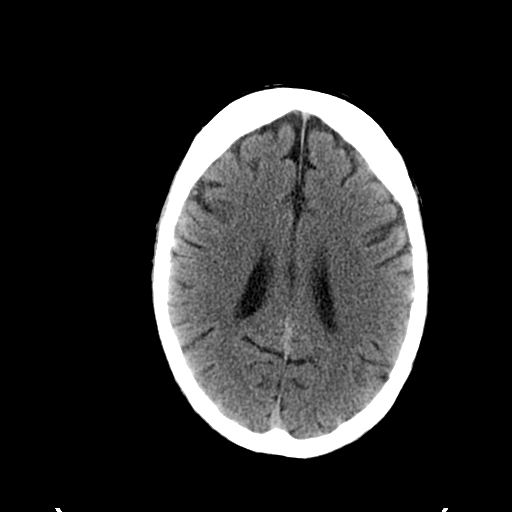
[im 19/36  bone]
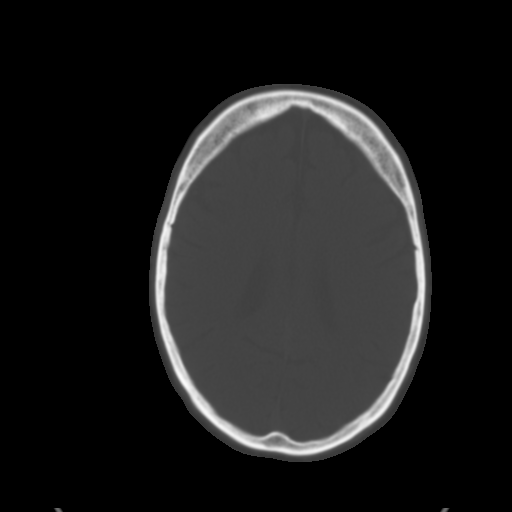
[im 21/36  brain]
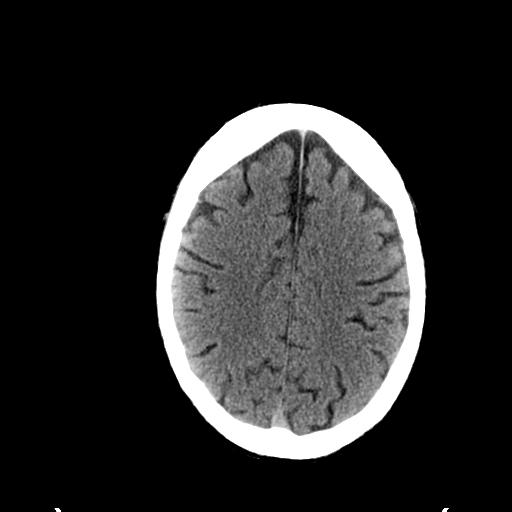
[im 23/36  brain]
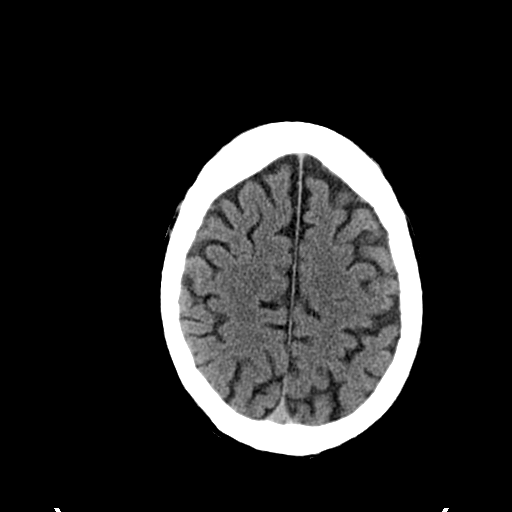
[im 26/36  brain]
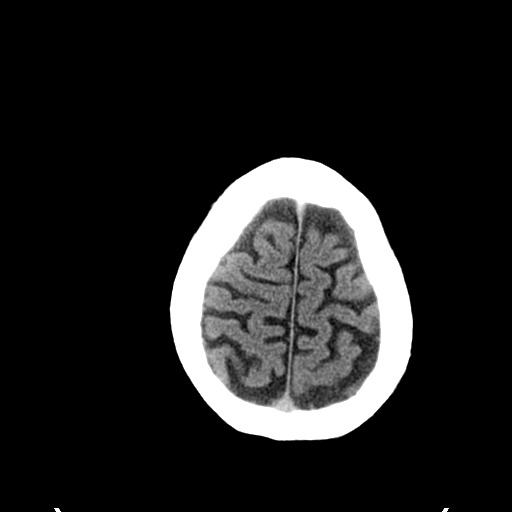
[im 27/36  brain]
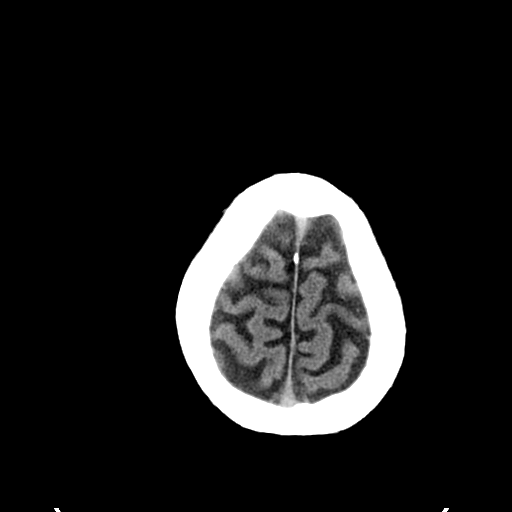
[im 27/36  bone]
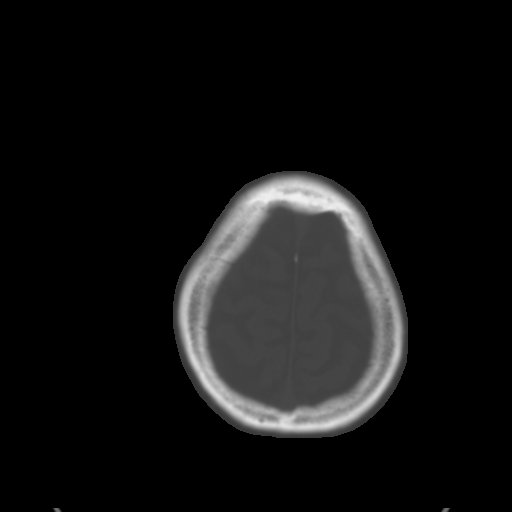
[im 29/36  brain]
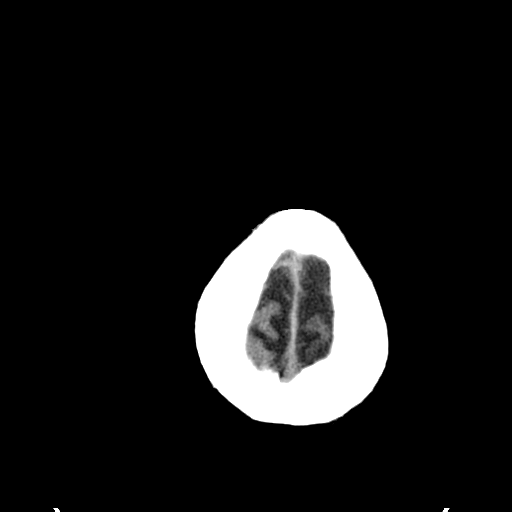
[im 32/36  brain]
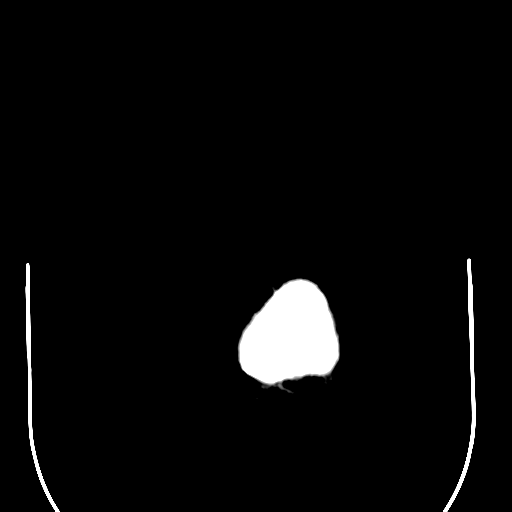
[im 34/36  brain]
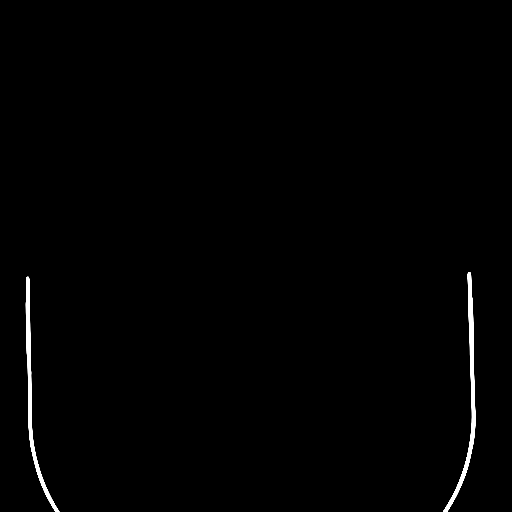

[16 of 30 positions shown; findings below may reference images not displayed]

FINDINGS: The brain demonstrates no evidence of hemorrhage, infarction, edema,
mass effect, extra-axial fluid collection, hydrocephalus or mass
lesion. The skull is unremarkable. There is some mucosal thickening
and a right frontal air cell extending into the fronto-ethmoid
region.
IMPRESSION: Normal head CT.
